# Patient Record
Sex: Male | Born: 1970 | Race: Black or African American | Hispanic: No | Marital: Married | Smoking: Never smoker
Health system: Southern US, Community
[De-identification: ages and names within clinical notes are randomized; demographics above are authoritative.]

## PROBLEM LIST (undated history)

## (undated) DIAGNOSIS — R51 Headache: Secondary | ICD-10-CM

## (undated) DIAGNOSIS — Z8669 Personal history of other diseases of the nervous system and sense organs: Secondary | ICD-10-CM

## (undated) DIAGNOSIS — R519 Headache, unspecified: Secondary | ICD-10-CM

## (undated) HISTORY — DX: Personal history of other diseases of the nervous system and sense organs: Z86.69

---

## 2002-10-09 ENCOUNTER — Emergency Department (HOSPITAL_COMMUNITY): Admission: EM | Admit: 2002-10-09 | Discharge: 2002-10-09 | Payer: Self-pay | Admitting: Emergency Medicine

## 2002-10-09 ENCOUNTER — Encounter: Payer: Self-pay | Admitting: Emergency Medicine

## 2002-12-25 ENCOUNTER — Encounter: Payer: Self-pay | Admitting: Orthopaedic Surgery

## 2002-12-25 ENCOUNTER — Ambulatory Visit (HOSPITAL_COMMUNITY): Admission: RE | Admit: 2002-12-25 | Discharge: 2002-12-25 | Payer: Self-pay | Admitting: Orthopaedic Surgery

## 2004-09-29 ENCOUNTER — Ambulatory Visit: Payer: Self-pay | Admitting: Family Medicine

## 2005-01-08 ENCOUNTER — Ambulatory Visit: Payer: Self-pay | Admitting: Family Medicine

## 2005-02-06 ENCOUNTER — Ambulatory Visit (HOSPITAL_COMMUNITY): Admission: RE | Admit: 2005-02-06 | Discharge: 2005-02-06 | Payer: Self-pay | Admitting: Family Medicine

## 2005-02-06 ENCOUNTER — Ambulatory Visit: Payer: Self-pay | Admitting: Family Medicine

## 2005-02-13 ENCOUNTER — Ambulatory Visit: Payer: Self-pay | Admitting: Family Medicine

## 2005-10-22 ENCOUNTER — Ambulatory Visit: Payer: Self-pay | Admitting: Family Medicine

## 2006-07-04 ENCOUNTER — Encounter: Payer: Self-pay | Admitting: Family Medicine

## 2006-07-04 DIAGNOSIS — J309 Allergic rhinitis, unspecified: Secondary | ICD-10-CM | POA: Insufficient documentation

## 2006-07-04 DIAGNOSIS — G43909 Migraine, unspecified, not intractable, without status migrainosus: Secondary | ICD-10-CM | POA: Insufficient documentation

## 2007-06-10 ENCOUNTER — Ambulatory Visit: Payer: Self-pay | Admitting: Family Medicine

## 2007-06-10 DIAGNOSIS — J019 Acute sinusitis, unspecified: Secondary | ICD-10-CM

## 2007-06-12 ENCOUNTER — Ambulatory Visit: Payer: Self-pay | Admitting: Family Medicine

## 2007-06-12 DIAGNOSIS — R209 Unspecified disturbances of skin sensation: Secondary | ICD-10-CM

## 2007-06-16 ENCOUNTER — Encounter (INDEPENDENT_AMBULATORY_CARE_PROVIDER_SITE_OTHER): Payer: Self-pay | Admitting: Family Medicine

## 2007-06-17 ENCOUNTER — Telehealth (INDEPENDENT_AMBULATORY_CARE_PROVIDER_SITE_OTHER): Payer: Self-pay | Admitting: *Deleted

## 2007-06-17 LAB — CONVERTED CEMR LAB
ALT: 36 units/L (ref 0–53)
AST: 29 units/L (ref 0–37)
Alkaline Phosphatase: 54 units/L (ref 39–117)
BUN: 13 mg/dL (ref 6–23)
Basophils Absolute: 0 10*3/uL (ref 0.0–0.1)
Basophils Relative: 1 % (ref 0–1)
CO2: 26 meq/L (ref 19–32)
Calcium: 9 mg/dL (ref 8.4–10.5)
Chloride: 106 meq/L (ref 96–112)
Cholesterol: 166 mg/dL (ref 0–200)
Creatinine, Ser: 1.29 mg/dL (ref 0.40–1.50)
Eosinophils Relative: 3 % (ref 0–5)
Glucose, Bld: 90 mg/dL (ref 70–99)
HCT: 42.6 % (ref 39.0–52.0)
HDL: 49 mg/dL (ref >39)
Hemoglobin: 14.8 g/dL (ref 13.0–17.0)
LDL Cholesterol: 102 mg/dL — ABNORMAL HIGH (ref 0–99)
MCHC: 34.7 g/dL (ref 30.0–36.0)
MCV: 89.9 fL (ref 78.0–100.0)
Monocytes Absolute: 0.4 10*3/uL (ref 0.1–1.0)
Monocytes Relative: 8 % (ref 3–12)
Neutro Abs: 2.3 10*3/uL (ref 1.7–7.7)
Neutrophils Relative %: 43 % (ref 43–77)
Platelets: 183 10*3/uL (ref 150–400)
RDW: 12.9 % (ref 11.5–15.5)
Sodium: 142 meq/L (ref 135–145)
TSH: 1.866 microintl units/mL (ref 0.350–5.50)
Total Bilirubin: 1.5 mg/dL — ABNORMAL HIGH (ref 0.3–1.2)
Total CHOL/HDL Ratio: 3.4
Total Protein: 7.3 g/dL (ref 6.0–8.3)
Triglycerides: 75 mg/dL (ref <150)
VLDL: 15 mg/dL (ref 0–40)
WBC: 5.4 10*3/uL (ref 4.0–10.5)

## 2007-07-31 ENCOUNTER — Encounter: Payer: Self-pay | Admitting: Family Medicine

## 2008-06-03 ENCOUNTER — Ambulatory Visit: Payer: Self-pay | Admitting: Family Medicine

## 2008-06-03 DIAGNOSIS — K219 Gastro-esophageal reflux disease without esophagitis: Secondary | ICD-10-CM | POA: Insufficient documentation

## 2008-06-18 ENCOUNTER — Encounter (INDEPENDENT_AMBULATORY_CARE_PROVIDER_SITE_OTHER): Payer: Self-pay | Admitting: Family Medicine

## 2008-06-21 ENCOUNTER — Encounter (INDEPENDENT_AMBULATORY_CARE_PROVIDER_SITE_OTHER): Payer: Self-pay | Admitting: *Deleted

## 2008-06-21 LAB — CONVERTED CEMR LAB
AST: 25 units/L (ref 0–37)
Albumin: 4.5 g/dL (ref 3.5–5.2)
Alkaline Phosphatase: 61 units/L (ref 39–117)
BUN: 15 mg/dL (ref 6–23)
Basophils Absolute: 0 10*3/uL (ref 0.0–0.1)
CO2: 25 meq/L (ref 19–32)
Calcium: 9.1 mg/dL (ref 8.4–10.5)
Chloride: 104 meq/L (ref 96–112)
Eosinophils Absolute: 0.1 10*3/uL (ref 0.0–0.7)
Eosinophils Relative: 2 % (ref 0–5)
Glucose, Bld: 92 mg/dL (ref 70–99)
HCT: 42.9 % (ref 39.0–52.0)
HDL: 53 mg/dL (ref >39)
Hemoglobin: 14.5 g/dL (ref 13.0–17.0)
LDL Cholesterol: 100 mg/dL — ABNORMAL HIGH (ref 0–99)
Lymphocytes Relative: 38 % (ref 12–46)
Lymphs Abs: 2.1 10*3/uL (ref 0.7–4.0)
MCHC: 33.8 g/dL (ref 30.0–36.0)
MCV: 89.4 fL (ref 78.0–100.0)
Monocytes Relative: 9 % (ref 3–12)
Neutro Abs: 2.9 10*3/uL (ref 1.7–7.7)
Neutrophils Relative %: 50 % (ref 43–77)
Platelets: 189 10*3/uL (ref 150–400)
Potassium: 4 meq/L (ref 3.5–5.3)
RBC: 4.8 M/uL (ref 4.22–5.81)
RDW: 13 % (ref 11.5–15.5)
Sodium: 141 meq/L (ref 135–145)
TSH: 2.169 microintl units/mL (ref 0.350–4.50)
Total Bilirubin: 1.4 mg/dL — ABNORMAL HIGH (ref 0.3–1.2)
Total CHOL/HDL Ratio: 3.1
Total Protein: 7.7 g/dL (ref 6.0–8.3)
Triglycerides: 63 mg/dL (ref <150)
VLDL: 13 mg/dL (ref 0–40)
WBC: 5.7 10*3/uL (ref 4.0–10.5)

## 2010-01-26 ENCOUNTER — Ambulatory Visit (HOSPITAL_COMMUNITY): Admission: RE | Admit: 2010-01-26 | Discharge: 2010-01-26 | Payer: Self-pay | Admitting: Family Medicine

## 2010-08-29 NOTE — Letter (Signed)
Summary: rpc chart  rpc chart   Imported By: Curtis Sites 05/17/2010 11:30:26  _____________________________________________________________________  External Attachment:    Type:   Image     Comment:   External Document

## 2013-10-15 ENCOUNTER — Emergency Department (HOSPITAL_COMMUNITY): Payer: BC Managed Care – PPO

## 2013-10-15 ENCOUNTER — Emergency Department (HOSPITAL_COMMUNITY)
Admission: EM | Admit: 2013-10-15 | Discharge: 2013-10-15 | Disposition: A | Discharge status: Home / Self Care | Payer: BC Managed Care – PPO | Attending: Emergency Medicine | Admitting: Emergency Medicine

## 2013-10-15 ENCOUNTER — Encounter (HOSPITAL_COMMUNITY): Payer: Self-pay | Admitting: Emergency Medicine

## 2013-10-15 DIAGNOSIS — Z791 Long term (current) use of non-steroidal anti-inflammatories (NSAID): Secondary | ICD-10-CM | POA: Insufficient documentation

## 2013-10-15 DIAGNOSIS — R05 Cough: Secondary | ICD-10-CM | POA: Insufficient documentation

## 2013-10-15 DIAGNOSIS — R0602 Shortness of breath: Secondary | ICD-10-CM | POA: Insufficient documentation

## 2013-10-15 DIAGNOSIS — R059 Cough, unspecified: Secondary | ICD-10-CM | POA: Insufficient documentation

## 2013-10-15 DIAGNOSIS — R0789 Other chest pain: Secondary | ICD-10-CM

## 2013-10-15 LAB — CBC WITH DIFFERENTIAL/PLATELET
Basophils Absolute: 0 10*3/uL (ref 0.0–0.1)
Basophils Relative: 0 % (ref 0–1)
EOS PCT: 2 % (ref 0–5)
Eosinophils Absolute: 0.1 10*3/uL (ref 0.0–0.7)
HCT: 39.2 % (ref 39.0–52.0)
Hemoglobin: 13.8 g/dL (ref 13.0–17.0)
LYMPHS ABS: 2.5 10*3/uL (ref 0.7–4.0)
LYMPHS PCT: 40 % (ref 12–46)
MCH: 31.2 pg (ref 26.0–34.0)
MCHC: 35.2 g/dL (ref 30.0–36.0)
MCV: 88.5 fL (ref 78.0–100.0)
MONOS PCT: 8 % (ref 3–12)
Monocytes Absolute: 0.5 10*3/uL (ref 0.1–1.0)
NEUTROS PCT: 51 % (ref 43–77)
Neutro Abs: 3.1 10*3/uL (ref 1.7–7.7)
PLATELETS: 203 10*3/uL (ref 150–400)
RBC: 4.43 MIL/uL (ref 4.22–5.81)
RDW: 12.8 % (ref 11.5–15.5)
WBC: 6.2 10*3/uL (ref 4.0–10.5)

## 2013-10-15 LAB — D-DIMER, QUANTITATIVE: D-Dimer, Quant: 0.4 ug/mL-FEU (ref 0.00–0.48)

## 2013-10-15 LAB — BASIC METABOLIC PANEL
BUN: 8 mg/dL (ref 6–23)
CO2: 27 mEq/L (ref 19–32)
Calcium: 9 mg/dL (ref 8.4–10.5)
Chloride: 105 mEq/L (ref 96–112)
Creatinine, Ser: 1.31 mg/dL (ref 0.50–1.35)
GFR calc Af Amer: 76 mL/min — ABNORMAL LOW (ref >90)
GFR calc non Af Amer: 65 mL/min — ABNORMAL LOW (ref >90)
GLUCOSE: 105 mg/dL — AB (ref 70–99)
Potassium: 4 mEq/L (ref 3.7–5.3)
Sodium: 142 mEq/L (ref 137–147)

## 2013-10-15 MED ORDER — NAPROXEN 500 MG PO TABS: 500.0000 mg | ORAL_TABLET | Freq: Two times a day (BID) | ORAL | Status: DC

## 2013-10-15 NOTE — ED Provider Notes (Signed)
CSN: [id number]     Arrival date & time 10/15/13  1436 History   This chart was scribed for Shelda Jakes, MD by Tana Conch, ED Scribe. This patient was seen in room APFT20/APFT20 and the patient's care was started at 3:50  PM.    Chief Complaint  Patient presents with  . Shoulder Pain     Patient is a 43 y.o. male presenting with shoulder pain. The history is provided by the patient. No language interpreter was used.  Shoulder Pain This is a new problem. The current episode started yesterday. The problem occurs rarely. The problem has been gradually worsening. Associated symptoms include chest pain and shortness of breath. Pertinent negatives include no abdominal pain and no headaches. The symptoms are aggravated by sneezing and coughing. Nothing relieves the symptoms. He has tried nothing for the symptoms.    HPI Comments: BORDEN THUNE is a 43 y.o. male who presents to the Emergency Department complaining of right shoulder blade pain that began yesterday and has worsened today. Pt reports that deep breaths, coughs make the pain worse and at times pt reports that he has associated SOB. Pt reports associated right anterior chest pain that started at the same time. Pt reports no previous injuries to the shoulder. Pt reports that he played "ball" Monday and felt fine afterward. Pain is localized medially along the right shoulder blade .     History reviewed. No pertinent past medical history. History reviewed. No pertinent past surgical history. History reviewed. No pertinent family history. History  Substance Use Topics  . Smoking status: Never Smoker   . Smokeless tobacco: Not on file  . Alcohol Use: No    Review of Systems  Constitutional: Negative for fever and chills.  HENT: Negative for rhinorrhea and sore throat.   Eyes: Negative for visual disturbance.  Respiratory: Positive for shortness of breath.   Cardiovascular: Positive for chest pain. Negative for leg  swelling.  Gastrointestinal: Negative for nausea, vomiting, abdominal pain and diarrhea.  Genitourinary: Negative for dysuria.  Musculoskeletal: Positive for back pain. Negative for neck pain.  Skin: Negative for rash.  Neurological: Negative for headaches.  Psychiatric/Behavioral: Negative for confusion.      Allergies  Azelastine hcl  Home Medications   Current Outpatient Rx  Name  Route  Sig  Dispense  Refill  . OVER THE COUNTER MEDICATION   Oral   Take 1 tablet by mouth daily as needed (for allergies). OTC allergy medication         . naproxen (NAPROSYN) 500 MG tablet   Oral   Take 1 tablet (500 mg total) by mouth 2 (two) times daily.   14 tablet   0    BP 118/76  Pulse 85  Temp(Src) 97.9 F (36.6 C) (Oral)  Resp 18  Ht 5\' 9"  (1.753 m)  Wt 178 lb (80.74 kg)  BMI 26.27 kg/m2  SpO2 97% Physical Exam  Nursing note and vitals reviewed. Constitutional: He is oriented to person, place, and time.  Cardiovascular: Normal rate, regular rhythm and normal heart sounds.   Pulmonary/Chest: Effort normal and breath sounds normal. Tenderness: right side   Abdominal: Bowel sounds are normal. There is no tenderness.  Musculoskeletal:       Arms: Neurological: He is alert and oriented to person, place, and time. No cranial nerve deficit. He exhibits normal muscle tone. Coordination normal.    ED Course  Procedures (including critical care time)  DIAGNOSTIC STUDIES: Oxygen Saturation is 97%  on RA, normal by my interpretation.    COORDINATION OF CARE:   3:58 PM-Discussed treatment plan which includes  with pt at bedside and pt agreed to plan.   Medications - No data to display  Results for orders placed during the hospital encounter of 10/15/13  D-DIMER, QUANTITATIVE      Result Value Ref Range   D-Dimer, Quant 0.40  0.00 - 0.48 ug/mL-FEU  BASIC METABOLIC PANEL      Result Value Ref Range   Sodium 142  137 - 147 mEq/L   Potassium 4.0  3.7 - 5.3 mEq/L   Chloride  105  96 - 112 mEq/L   CO2 27  19 - 32 mEq/L   Glucose, Bld 105 (*) 70 - 99 mg/dL   BUN 8  6 - 23 mg/dL   Creatinine, Ser 1.61  0.50 - 1.35 mg/dL   Calcium 9.0  8.4 - 09.6 mg/dL   GFR calc non Af Amer 65 (*) >90 mL/min   GFR calc Af Amer 76 (*) >90 mL/min  CBC WITH DIFFERENTIAL      Result Value Ref Range   WBC 6.2  4.0 - 10.5 K/uL   RBC 4.43  4.22 - 5.81 MIL/uL   Hemoglobin 13.8  13.0 - 17.0 g/dL   HCT 04.5  40.9 - 81.1 %   MCV 88.5  78.0 - 100.0 fL   MCH 31.2  26.0 - 34.0 pg   MCHC 35.2  30.0 - 36.0 g/dL   RDW 91.4  78.2 - 95.6 %   Platelets 203  150 - 400 K/uL   Neutrophils Relative % 51  43 - 77 %   Neutro Abs 3.1  1.7 - 7.7 K/uL   Lymphocytes Relative 40  12 - 46 %   Lymphs Abs 2.5  0.7 - 4.0 K/uL   Monocytes Relative 8  3 - 12 %   Monocytes Absolute 0.5  0.1 - 1.0 K/uL   Eosinophils Relative 2  0 - 5 %   Eosinophils Absolute 0.1  0.0 - 0.7 K/uL   Basophils Relative 0  0 - 1 %   Basophils Absolute 0.0  0.0 - 0.1 K/uL    Labs Review Labs Reviewed  BASIC METABOLIC PANEL - Abnormal; Notable for the following:    Glucose, Bld 105 (*)    GFR calc non Af Amer 65 (*)    GFR calc Af Amer 76 (*)    All other components within normal limits  D-DIMER, QUANTITATIVE  CBC WITH DIFFERENTIAL   Imaging Review Dg Chest 2 View  10/15/2013   CLINICAL DATA:  Possible right-sided lung nodule on the rib films.  EXAM: CHEST  2 VIEW  COMPARISON:  DG RIBS UNILATERAL W/CHEST*R* dated 10/15/2013  FINDINGS: 1808 hr. Bilateral nipple markers in place. Midline trachea. Normal heart size and mediastinal contours. No pleural effusion or pneumothorax. The questioned lung nodule on the prior exam is not appreciated. Lungs are clear.  IMPRESSION: No evidence of pulmonary nodule. No acute cardiopulmonary disease.   Electronically Signed   By: Jeronimo Greaves M.D.   On: 10/15/2013 18:25   Dg Ribs Unilateral W/chest Right  10/15/2013   CLINICAL DATA:  SHOULDER PAIN  EXAM: RIGHT RIBS AND CHEST - 3+ VIEW   COMPARISON:  None.  FINDINGS: No fracture or other bone lesions are seen involving the ribs. There is no evidence of pneumothorax or pleural effusion. A 9 mm nodular density projects within the right lung base. Finding may  represent a confluence of normal densities, a pulmonary nodule, or possibly a nipple shadow. Repeat PA and lateral view of the chest is recommended with nipple markers. The lungs otherwise clear.  IMPRESSION: No evidence of acute osseous abnormalities. Nodular density right lung base further evaluation with dedicated PA and lateral chest radiograph with nipple markers is recommended.   Electronically Signed   By: Salome HolmesHector  Cooper M.D.   On: 10/15/2013 17:10     EKG Interpretation   Date/Time:  Thursday October 15 2013 16:04:56 EDT Ventricular Rate:  42 PR Interval:  150 QRS Duration: 106 QT Interval:  514 QTC Calculation: 429 R Axis:   49 Text Interpretation:  Marked sinus bradycardia Nonspecific ST abnormality  Abnormal ECG No previous ECGs available Confirmed by Dillie Burandt  MD, Jaidev Sanger  [phone]([id number]) on 10/15/2013 5:54:54 PM      Date: 10/15/2013  Rate: 42  Rhythm: sinus bradycardia  QRS Axis: normal  Intervals: normal  ST/T Wave abnormalities: nonspecific ST changes  Conduction Disutrbances:none  Narrative Interpretation:   Old EKG Reviewed: none available     MDM   Final diagnoses:  Chest pain, muscular   I personally performed the services described in this documentation, which was scribed in my presence. The recorded information has been reviewed and is accurate.   Workup for the chest pain that was pleuritic in nature seems to be more muscular in nature. No evidence of pulmonary embolus with a negative d-dimer. EKG without acute cardiac changes. Chest x-ray negative for pneumonia pulmonary edema pneumothorax. There was some concern for a nodule but on PA and lateral that was not present. Patient's discomfort is probably muscular in nature small it is in the right  rhomboid muscles around the scapula. Patient retrieve anti-inflammatories and reassurance. Patient primary care Dr. followup with.    Shelda JakesScott W. Insiya Oshea, MD 10/15/13 [phone]

## 2013-10-15 NOTE — Discharge Instructions (Signed)
Chest Wall Pain Chest wall pain is pain in or around the bones and muscles of your chest. It may take up to 6 weeks to get better. It may take longer if you must stay physically active in your work and activities.  CAUSES  Chest wall pain may happen on its own. However, it may be caused by:  A viral illness like the flu.  Injury.  Coughing.  Exercise.  Arthritis.  Fibromyalgia.  Shingles. HOME CARE INSTRUCTIONS   Avoid overtiring physical activity. Try not to strain or perform activities that cause pain. This includes any activities using your chest or your abdominal and side muscles, especially if heavy weights are used.  Put ice on the sore area.  Put ice in a plastic bag.  Place a towel between your skin and the bag.  Leave the ice on for 15-20 minutes per hour while awake for the first 2 days.  Only take over-the-counter or prescription medicines for pain, discomfort, or fever as directed by your caregiver. SEEK IMMEDIATE MEDICAL CARE IF:   Your pain increases, or you are very uncomfortable.  You have a fever.  Your chest pain becomes worse.  You have new, unexplained symptoms.  You have nausea or vomiting.  You feel sweaty or lightheaded.  You have a cough with phlegm (sputum), or you cough up blood. MAKE SURE YOU:   Understand these instructions.  Will watch your condition.  Will get help right away if you are not doing well or get worse. Document Released: 07/16/2005 Document Revised: 10/08/2011 Document Reviewed: 03/12/2011 [facility] Patient Information 2014 [employer] [place] [place].  Take antinflammatory medicine as directed. Her workup was negative. Return for any new or worse symptoms. Wouldn't expect you to be better in about 4 days if not follow up with your regular Dr.

## 2013-10-15 NOTE — ED Notes (Signed)
Pt seen and evaluated by EDP for initial assessment. 

## 2013-10-15 NOTE — ED Notes (Signed)
Pain rt scapular area since yesterday Increased pain with movement.  No known injury ,no cough or cold

## 2015-04-26 IMAGING — CR DG CHEST 2V
2 series · 2 of 2 positions shown · non-contrast
Comparison: DG RIBS UNILATERAL W/CHEST*R* dated 10/15/2013

CLINICAL DATA: Possible right-sided lung nodule on the rib films.

EXAM:
CHEST  2 VIEW

[view not recorded (1 of 2)]
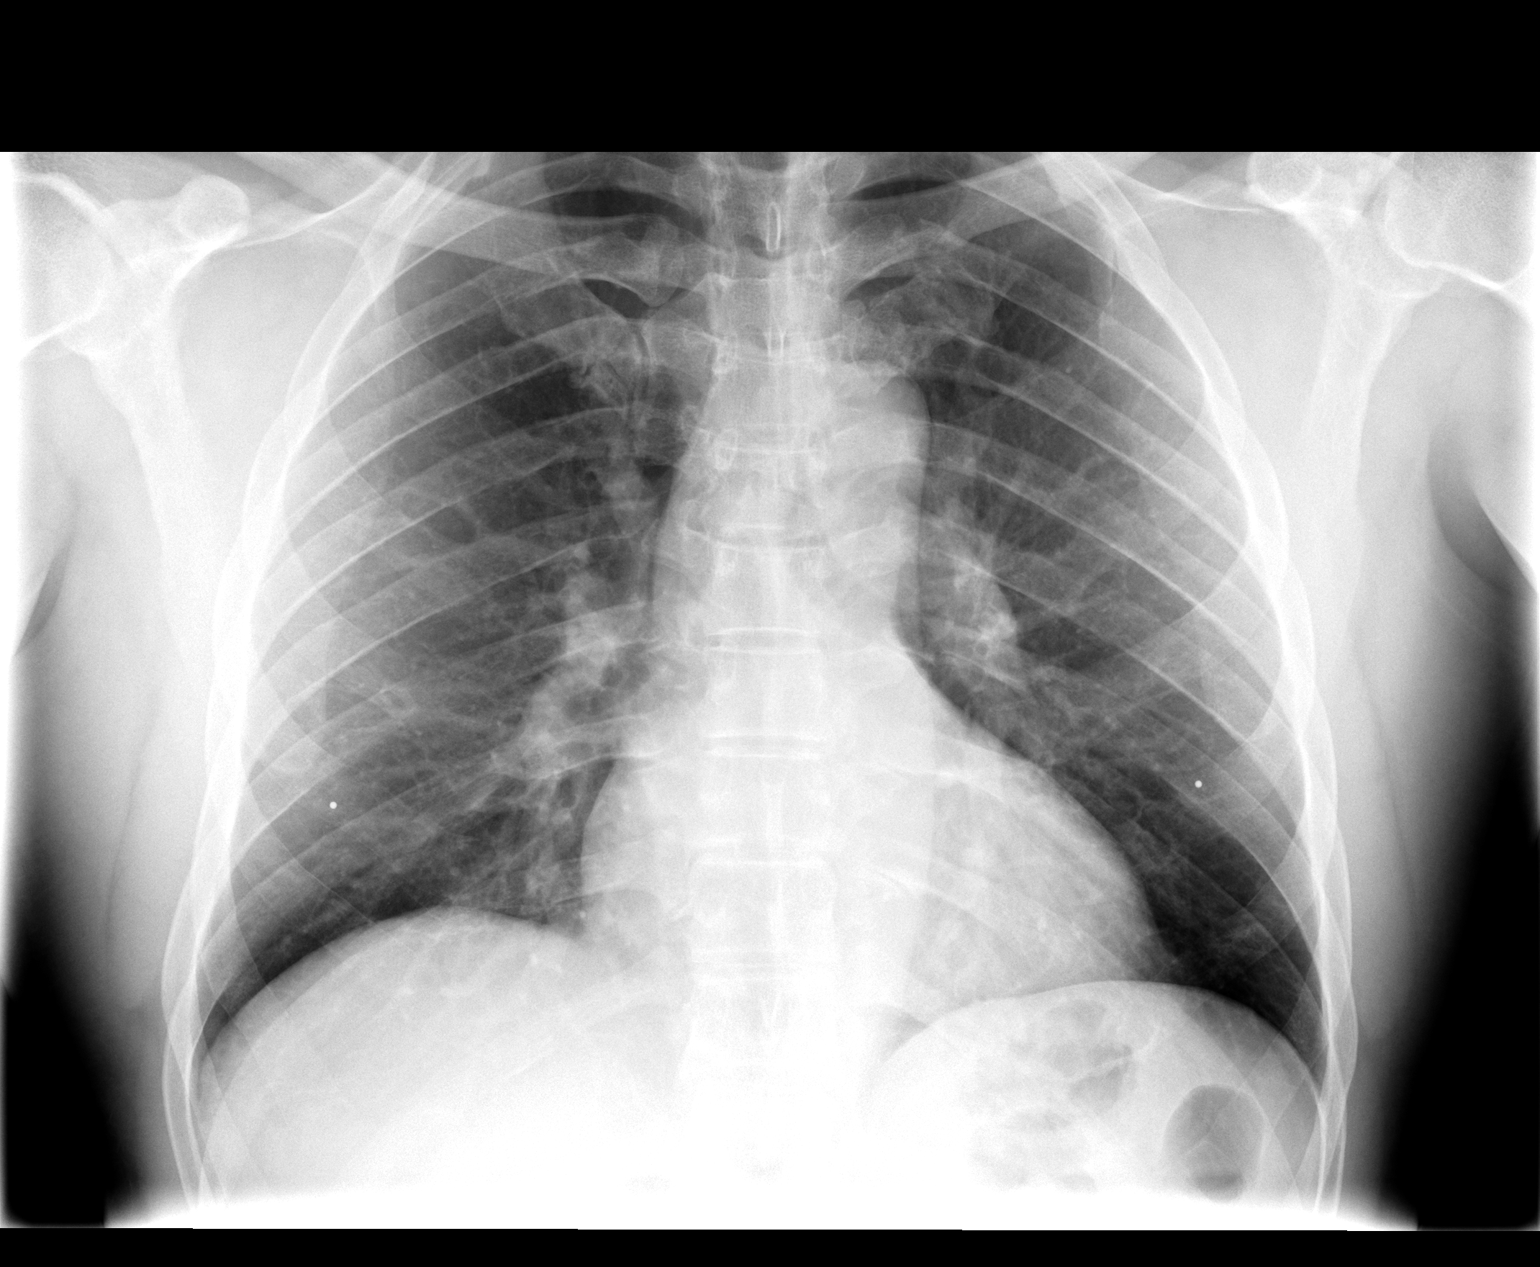

[view not recorded (2 of 2)]
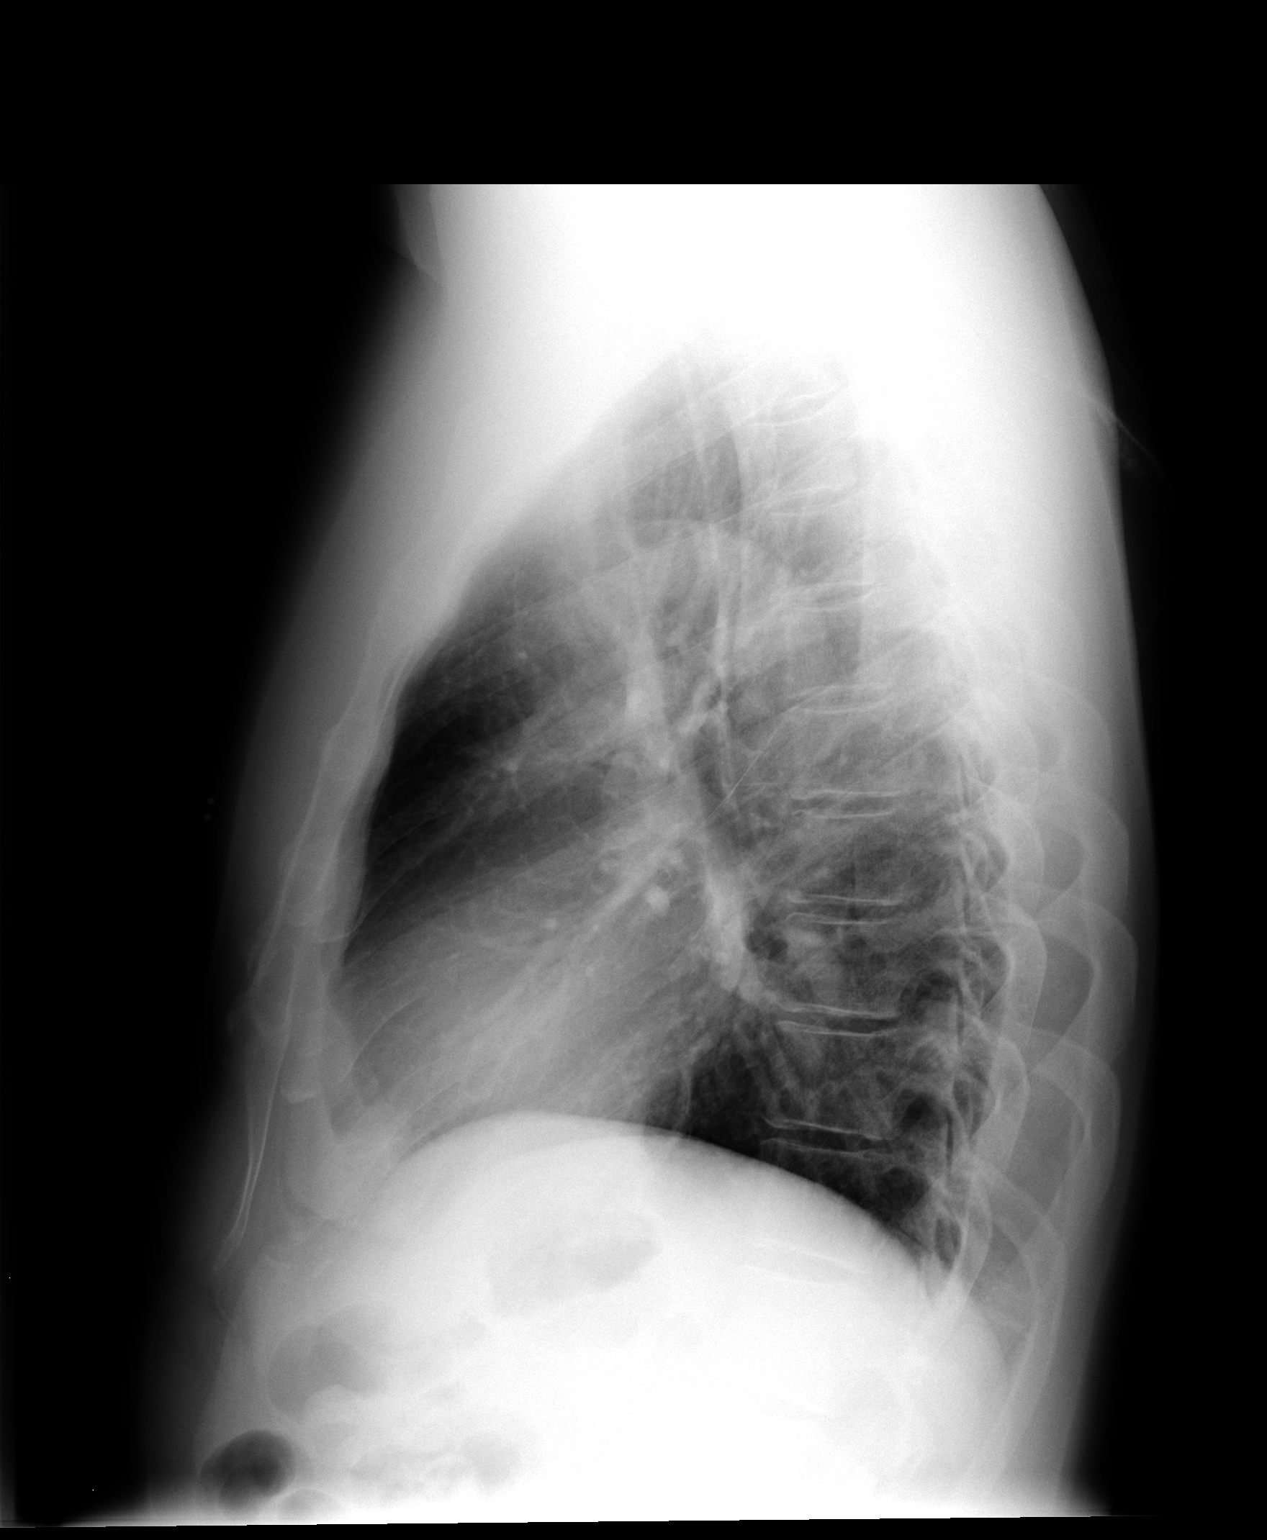

[2 of 2 positions shown; findings below may reference images not displayed]

FINDINGS: 0303 hr. Bilateral nipple markers in place. Midline trachea. Normal
heart size and mediastinal contours. No pleural effusion or
pneumothorax. The questioned lung nodule on the prior exam is not
appreciated. Lungs are clear.
IMPRESSION: No evidence of pulmonary nodule. No acute cardiopulmonary disease.

## 2015-04-26 IMAGING — CR DG RIBS W/ CHEST 3+V*R*
5 series · 5 of 5 positions shown · non-contrast
Comparison: None.

CLINICAL DATA: SHOULDER PAIN

EXAM:
RIGHT RIBS AND CHEST - 3+ VIEW

[view not recorded (1 of 5)]
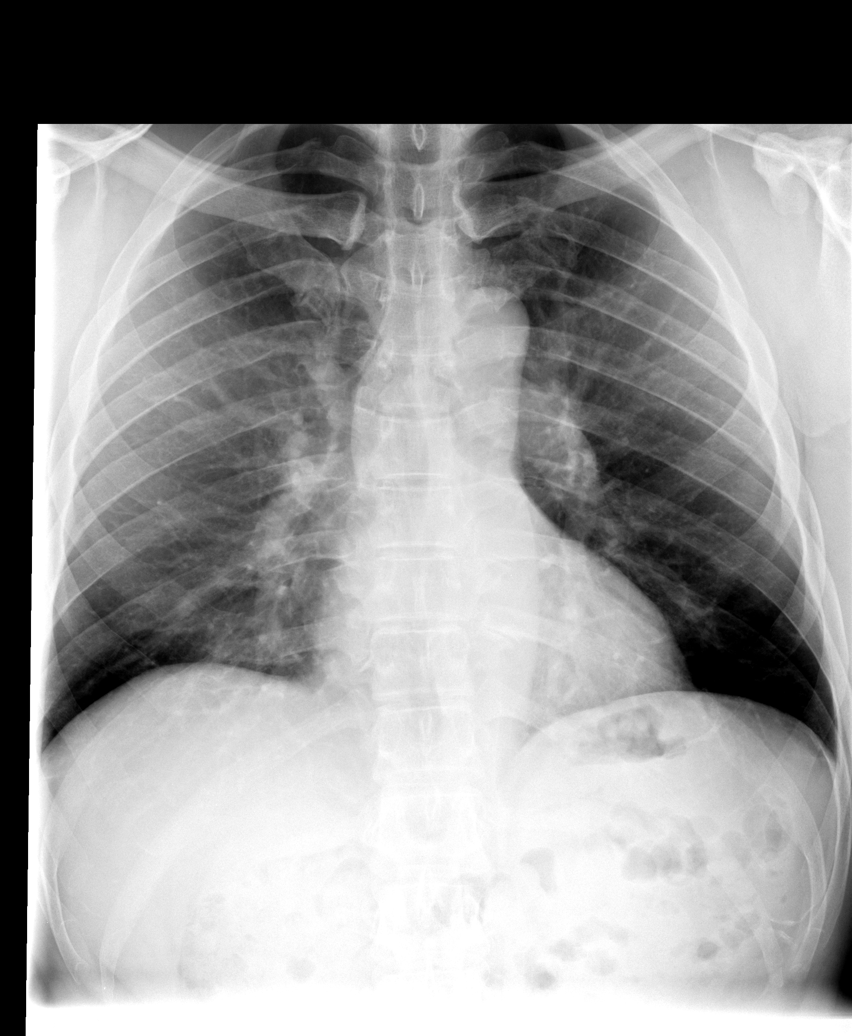

[view not recorded (2 of 5)]
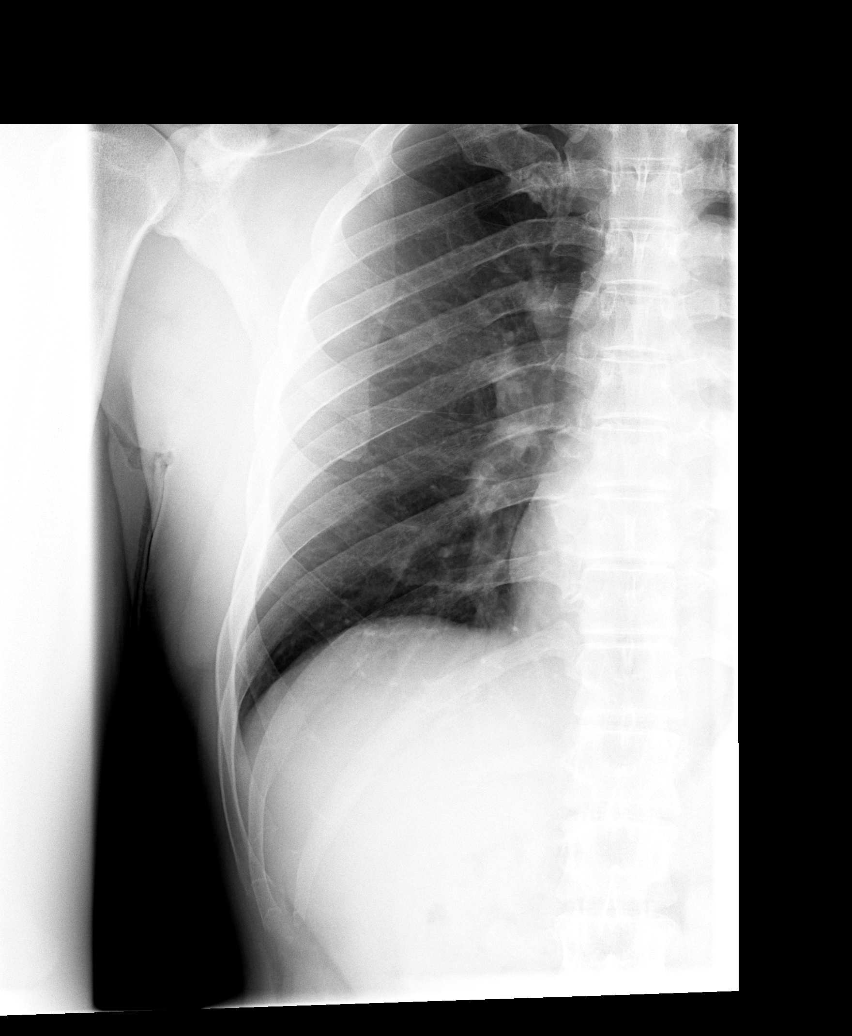

[view not recorded (3 of 5)]
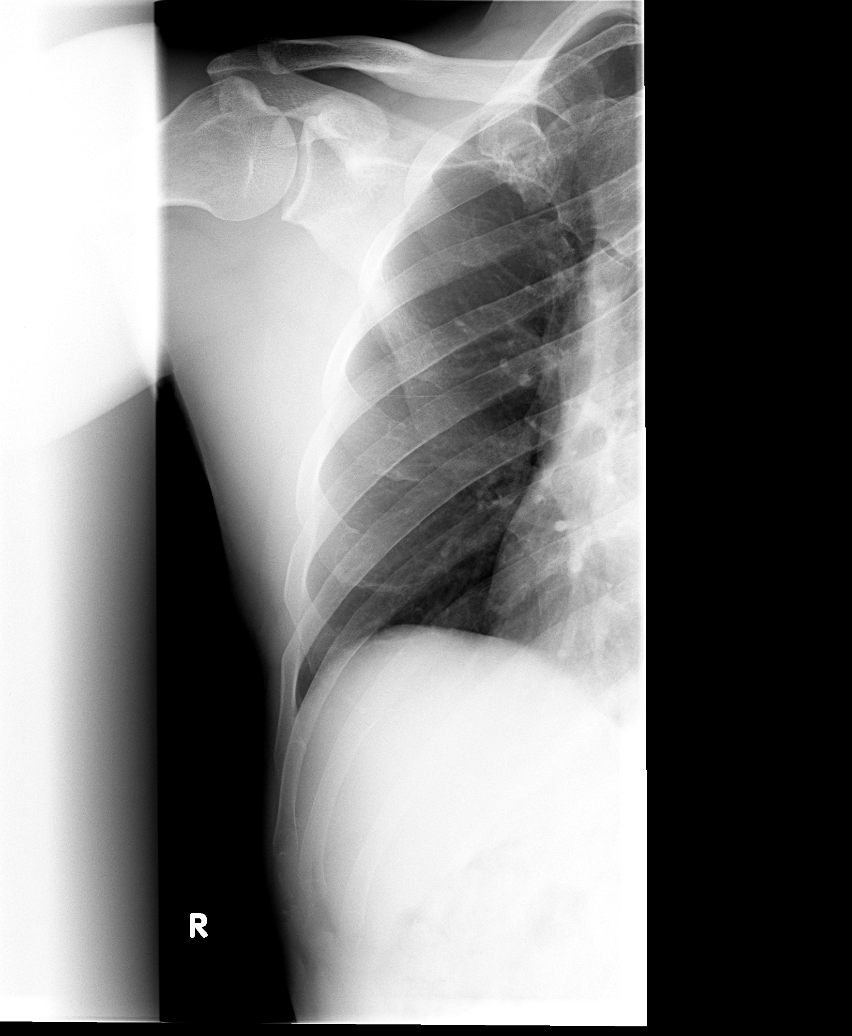

[view not recorded (4 of 5)]
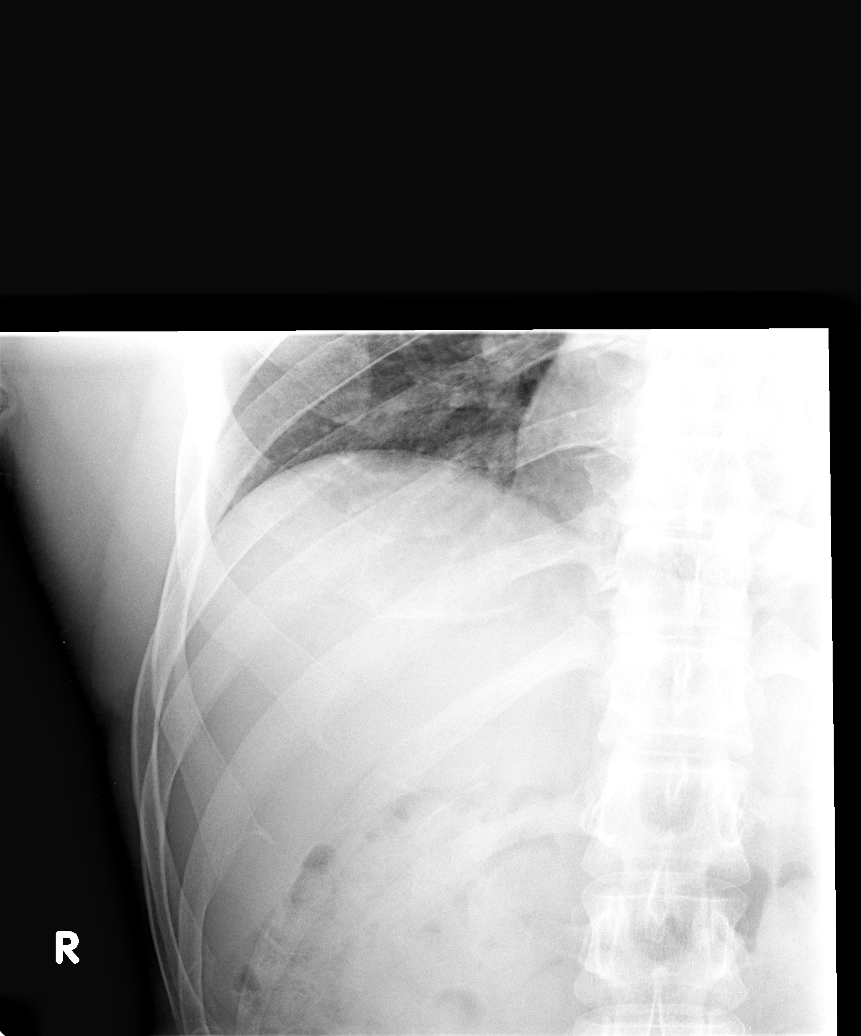

[view not recorded (5 of 5)]
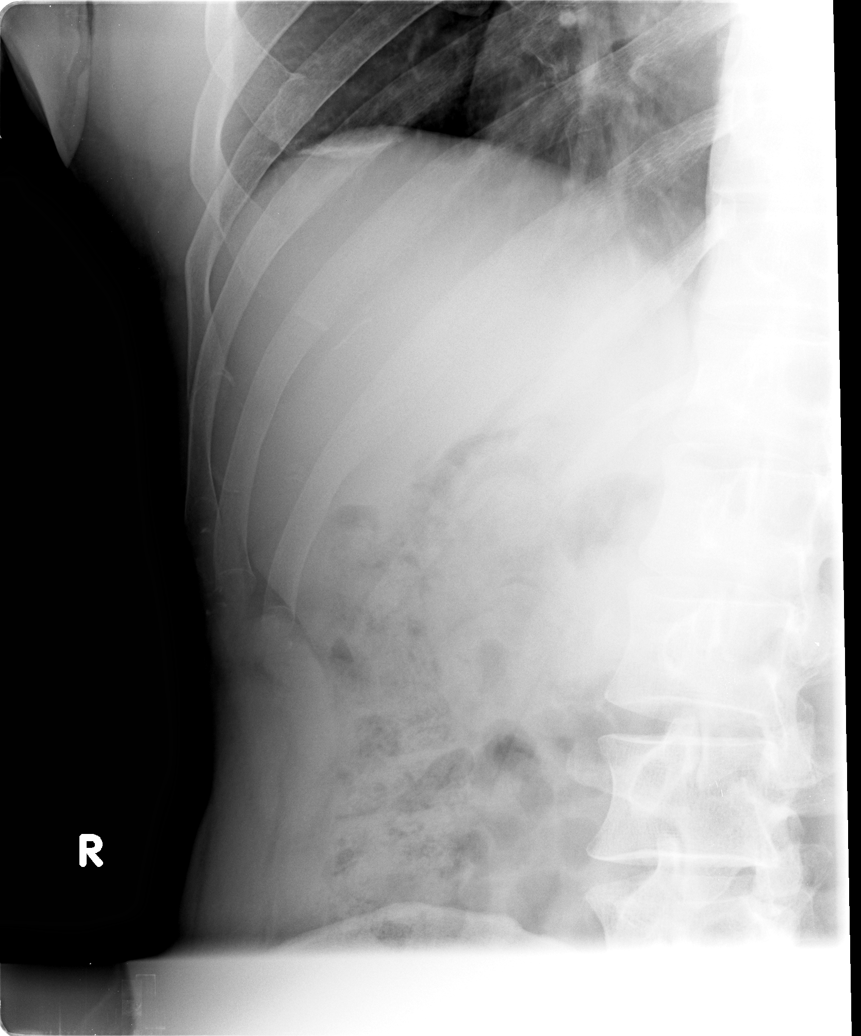

[5 of 5 positions shown; findings below may reference images not displayed]

FINDINGS: No fracture or other bone lesions are seen involving the ribs. There
is no evidence of pneumothorax or pleural effusion. A 9 mm nodular
density projects within the right lung base. Finding may represent a
confluence of normal densities, a pulmonary nodule, or possibly a
nipple shadow. Repeat PA and lateral view of the chest is
recommended with nipple markers. The lungs otherwise clear.
IMPRESSION: No evidence of acute osseous abnormalities. Nodular density right
lung base further evaluation with dedicated PA and lateral chest
radiograph with nipple markers is recommended.

## 2015-10-07 ENCOUNTER — Encounter (HOSPITAL_COMMUNITY): Payer: Self-pay | Admitting: Emergency Medicine

## 2015-10-07 ENCOUNTER — Emergency Department (HOSPITAL_COMMUNITY)
Admission: EM | Admit: 2015-10-07 | Discharge: 2015-10-07 | Disposition: A | Discharge status: Home / Self Care | Payer: BLUE CROSS/BLUE SHIELD | Attending: Emergency Medicine | Admitting: Emergency Medicine

## 2015-10-07 DIAGNOSIS — Z791 Long term (current) use of non-steroidal anti-inflammatories (NSAID): Secondary | ICD-10-CM | POA: Insufficient documentation

## 2015-10-07 DIAGNOSIS — Z7982 Long term (current) use of aspirin: Secondary | ICD-10-CM | POA: Diagnosis not present

## 2015-10-07 DIAGNOSIS — S39012A Strain of muscle, fascia and tendon of lower back, initial encounter: Secondary | ICD-10-CM | POA: Insufficient documentation

## 2015-10-07 DIAGNOSIS — Y999 Unspecified external cause status: Secondary | ICD-10-CM | POA: Insufficient documentation

## 2015-10-07 DIAGNOSIS — Y929 Unspecified place or not applicable: Secondary | ICD-10-CM | POA: Insufficient documentation

## 2015-10-07 DIAGNOSIS — Y93F2 Activity, caregiving, lifting: Secondary | ICD-10-CM | POA: Diagnosis not present

## 2015-10-07 DIAGNOSIS — Z79899 Other long term (current) drug therapy: Secondary | ICD-10-CM | POA: Diagnosis not present

## 2015-10-07 DIAGNOSIS — X58XXXA Exposure to other specified factors, initial encounter: Secondary | ICD-10-CM | POA: Diagnosis not present

## 2015-10-07 DIAGNOSIS — S3992XA Unspecified injury of lower back, initial encounter: Secondary | ICD-10-CM | POA: Diagnosis present

## 2015-10-07 HISTORY — DX: Headache, unspecified: R51.9

## 2015-10-07 HISTORY — DX: Headache: R51

## 2015-10-07 MED ORDER — NAPROXEN 500 MG PO TABS: 500.0000 mg | ORAL_TABLET | Freq: Two times a day (BID) | ORAL | Status: DC

## 2015-10-07 MED ORDER — METHOCARBAMOL 500 MG PO TABS
500.0000 mg | ORAL_TABLET | Freq: Once | ORAL | Status: AC
  Administered 2015-10-07: 500 mg via ORAL
  Filled 2015-10-07: qty 1

## 2015-10-07 MED ORDER — HYDROCODONE-ACETAMINOPHEN 5-325 MG PO TABS: 1.0000 | ORAL_TABLET | ORAL | Status: DC | PRN

## 2015-10-07 MED ORDER — METHOCARBAMOL 500 MG PO TABS: 1000.0000 mg | ORAL_TABLET | Freq: Four times a day (QID) | ORAL | Status: AC

## 2015-10-07 MED ORDER — HYDROCODONE-ACETAMINOPHEN 5-325 MG PO TABS
1.0000 | ORAL_TABLET | Freq: Once | ORAL | Status: AC
  Administered 2015-10-07: 1 via ORAL
  Filled 2015-10-07: qty 1

## 2015-10-07 NOTE — Discharge Instructions (Signed)
Lumbosacral Strain Lumbosacral strain is a strain of any of the parts that make up your lumbosacral vertebrae. Your lumbosacral vertebrae are the bones that make up the lower third of your backbone. Your lumbosacral vertebrae are held together by muscles and tough, fibrous tissue (ligaments).  CAUSES  A sudden blow to your back can cause lumbosacral strain. Also, anything that causes an excessive stretch of the muscles in the low back can cause this strain. This is typically seen when people exert themselves strenuously, fall, lift heavy objects, bend, or crouch repeatedly. RISK FACTORS  Physically demanding work.  Participation in pushing or pulling sports or sports that require a sudden twist of the back (tennis, golf, baseball).  Weight lifting.  Excessive lower back curvature.  Forward-tilted pelvis.  Weak back or abdominal muscles or both.  Tight hamstrings. SIGNS AND SYMPTOMS  Lumbosacral strain may cause pain in the area of your injury or pain that moves (radiates) down your leg.  DIAGNOSIS Your health care provider can often diagnose lumbosacral strain through a physical exam. In some cases, you may need tests such as X-ray exams.  TREATMENT  Treatment for your lower back injury depends on many factors that your clinician will have to evaluate. However, most treatment will include the use of anti-inflammatory medicines. HOME CARE INSTRUCTIONS   Avoid hard physical activities (tennis, racquetball, waterskiing) if you are not in proper physical condition for it. This may aggravate or create problems.  If you have a back problem, avoid sports requiring sudden body movements. Swimming and walking are generally safer activities.  Maintain good posture.  Maintain a healthy weight.  For acute conditions, you may put ice on the injured area.  Put ice in a plastic bag.  Place a towel between your skin and the bag.  Leave the ice on for 20 minutes, 2-3 times a day.  When the  low back starts healing, stretching and strengthening exercises may be recommended. SEEK MEDICAL CARE IF:  Your back pain is getting worse.  You experience severe back pain not relieved with medicines. SEEK IMMEDIATE MEDICAL CARE IF:   You have numbness, tingling, weakness, or problems with the use of your arms or legs.  There is a change in bowel or bladder control.  You have increasing pain in any area of the body, including your belly (abdomen).  You notice shortness of breath, dizziness, or feel faint.  You feel sick to your stomach (nauseous), are throwing up (vomiting), or become sweaty.  You notice discoloration of your toes or legs, or your feet get very cold. MAKE SURE YOU:   Understand these instructions.  Will watch your condition.  Will get help right away if you are not doing well or get worse.   This information is not intended to replace advice given to you by your health care provider. Make sure you discuss any questions you have with your health care provider.   Document Released: 04/25/2005 Document Revised: 08/06/2014 Document Reviewed: 03/04/2013 [employer] Interactive Patient Education 2016 [employer]     Do not drive within 4 hours of taking hydrocodone as this will make you drowsy.  Avoid lifting,  Bending,  Twisting or any other activity that worsens your pain over the next week.  Apply a heating pad for 20 minutes 3 times daily.  You should get rechecked if your symptoms are not better over the next 5 days,  Or you develop increased pain,  Weakness in your leg(s) or loss of bladder or  bowel function - these are symptoms of a worse injury.

## 2015-10-07 NOTE — ED Notes (Signed)
Patient with c/o lower right side back pain after bending over and picking something up. Denies Bowel/bladder loss of control. States felt a pop.

## 2015-10-07 NOTE — ED Provider Notes (Signed)
CSN: [id number]     Arrival date & time 10/07/15  1005 History  By signing my name below, I, [name], attest that this documentation has been prepared under the direction and in the presence of Burgess AmorJulie Aisley Whan, PA-C. Electronically Signed: Marica OtterNusrat Rahman, ED Scribe. 10/09/2015. 11:46 AM.  Chief Complaint  Patient presents with  . Back Pain   The history is provided by the patient. No language interpreter was used.   PCP: No PCP Per Patient HPI Comments: Frederick Richardson is a 45 y.o. male, with PMHx noted below, who presents to the Emergency Department complaining of atraumatic (pt denies any specific injury or fall, however, notes his job often requires him to lift heavy objects), sudden onset, 6/10, aching, lower, right sided back pain onset 4 days ago after pt bent over to pick something up and felt a pop. Aggravating factors include movement. Pt reports using a heating pad and taking OTC meds including Aleve, ibuprofen, and tylenol at home with some relief.  Pt reports one prior episode of similar Sx a few years ago. Pt denies bowel or urinary incontinence, hematuria, dysuria, radiating pain, weakness of BLE. Pt also denies Hx of kidney stones.   Past Medical History  Diagnosis Date  . Headache   . GERD (gastroesophageal reflux disease)    History reviewed. No pertinent past surgical history. No family history on file. Social History  Substance Use Topics  . Smoking status: Never Smoker   . Smokeless tobacco: None  . Alcohol Use: No    Review of Systems  Constitutional: Negative for appetite change and fatigue.  HENT: Negative for congestion, ear discharge and sinus pressure.   Eyes: Negative for discharge.  Respiratory: Negative for cough.   Cardiovascular: Negative for chest pain.  Gastrointestinal: Negative for abdominal pain and diarrhea.  Genitourinary: Negative for dysuria, frequency, hematuria and difficulty urinating.  Musculoskeletal: Positive for back pain.  Skin:  Negative for rash.  Neurological: Negative for seizures, weakness and headaches.  Psychiatric/Behavioral: Negative for hallucinations.   Allergies  Azelastine hcl  Home Medications   Prior to Admission medications   Medication Sig Start Date End Date Taking? Authorizing Provider  Aspirin-Caffeine 845-65 MG PACK Take 1 packet by mouth daily as needed (pain).   Yes Historical Provider, MD  diphenhydrAMINE (BENADRYL) 25 mg capsule Take 25 mg by mouth daily.   Yes Historical Provider, MD  naproxen sodium (ANAPROX) 220 MG tablet Take 220 mg by mouth daily as needed (pain).   Yes Historical Provider, MD  HYDROcodone-acetaminophen (NORCO/VICODIN) 5-325 MG tablet Take 1 tablet by mouth every 4 (four) hours as needed. 10/07/15   Burgess AmorJulie Sylvie Mifsud, PA-C  methocarbamol (ROBAXIN) 500 MG tablet Take 2 tablets (1,000 mg total) by mouth 4 (four) times daily. 10/07/15 10/17/15  Burgess AmorJulie Ronzell Laban, PA-C  naproxen (NAPROSYN) 500 MG tablet Take 1 tablet (500 mg total) by mouth 2 (two) times daily. 10/07/15   Burgess AmorJulie Kyaire Gruenewald, PA-C   Triage Vitals: BP 128/82 mmHg  Pulse 54  Temp(Src) 97.8 F (36.6 C) (Oral)  Resp 18  Ht 5\' 9"  (1.753 m)  Wt 178 lb (80.74 kg)  BMI 26.27 kg/m2  SpO2 98% Physical Exam  Constitutional: He appears well-developed and well-nourished.  HENT:  Head: Normocephalic.  Eyes: Conjunctivae are normal.  Neck: Normal range of motion. Neck supple.  Cardiovascular: Normal rate and intact distal pulses.   Pedal pulses normal.  Pulmonary/Chest: Effort normal.  Abdominal: Soft. Bowel sounds are normal. He exhibits no distension and no mass.  Musculoskeletal: Normal range of motion. He exhibits no edema.       Lumbar back: He exhibits tenderness. He exhibits no swelling, no edema and no spasm.  Neurological: He is alert. He has normal strength. He displays no atrophy and no tremor. No sensory deficit. Gait normal.  Reflex Scores:      Patellar reflexes are 2+ on the right side and 2+ on the left side.       Achilles reflexes are 2+ on the right side and 2+ on the left side. No strength deficit noted in hip and knee flexor and extensor muscle groups.  Ankle flexion and extension intact.  Skin: Skin is warm and dry.  Psychiatric: He has a normal mood and affect.  Nursing note and vitals reviewed.   ED Course  Procedures (including critical care time) DIAGNOSTIC STUDIES: Oxygen Saturation is 98% on ra, nl by my interpretation.    COORDINATION OF CARE: 11:41 AM: Discussed treatment plan which includes antiinflammatories, muscle relaxer, meds for pain management, work note, heating pad use, and return precautions with pt at bedside; patient verbalizes understanding and agrees with treatment plan.  MDM   Final diagnoses:  Lumbar strain, initial encounter    Provider not advised of pt's dc vital signs.  Unaware of heart rate of 40 at dc.  No sx or complaint of sx suggesting symptomatic bradycardia during visit.  I personally performed the services described in this documentation, which was scribed in my presence. The recorded information has been reviewed and is accurate.   Burgess Amor, PA-C 10/09/15 [id number]  Samuel Jester, DO 10/10/15 [phone]

## 2016-03-05 ENCOUNTER — Ambulatory Visit: Payer: BLUE CROSS/BLUE SHIELD | Admitting: Family Medicine

## 2016-03-20 ENCOUNTER — Ambulatory Visit (INDEPENDENT_AMBULATORY_CARE_PROVIDER_SITE_OTHER): Payer: BLUE CROSS/BLUE SHIELD | Admitting: Family Medicine

## 2016-03-20 ENCOUNTER — Encounter: Payer: Self-pay | Admitting: Family Medicine

## 2016-03-20 VITALS — BP 146/100 | HR 76 | Temp 97.7°F | Resp 14 | Ht 69.0 in | Wt 175.0 lb

## 2016-03-20 DIAGNOSIS — Z0000 Encounter for general adult medical examination without abnormal findings: Secondary | ICD-10-CM

## 2016-03-20 LAB — CBC WITH DIFFERENTIAL/PLATELET
BASOS ABS: 0 {cells}/uL (ref 0–200)
Basophils Relative: 0 %
Eosinophils Absolute: 148 cells/uL (ref 15–500)
Eosinophils Relative: 2 %
HCT: 41.9 % (ref 38.5–50.0)
HEMOGLOBIN: 14 g/dL (ref 13.0–17.0)
LYMPHS ABS: 3626 {cells}/uL (ref 850–3900)
Lymphocytes Relative: 49 %
MCH: 30.1 pg (ref 27.0–33.0)
MCHC: 33.4 g/dL (ref 32.0–36.0)
MCV: 90.1 fL (ref 80.0–100.0)
MONOS PCT: 9 %
MPV: 10.6 fL (ref 7.5–12.5)
Monocytes Absolute: 666 cells/uL (ref 200–950)
NEUTROS PCT: 40 %
Neutro Abs: 2960 cells/uL (ref 1500–7800)
Platelets: 202 10*3/uL (ref 140–400)
RBC: 4.65 MIL/uL (ref 4.20–5.80)
RDW: 14 % (ref 11.0–15.0)
WBC: 7.4 10*3/uL (ref 3.8–10.8)

## 2016-03-20 LAB — COMPLETE METABOLIC PANEL WITH GFR
ALBUMIN: 4.1 g/dL (ref 3.6–5.1)
ALK PHOS: 48 U/L (ref 40–115)
ALT: 35 U/L (ref 9–46)
AST: 32 U/L (ref 10–40)
BUN: 9 mg/dL (ref 7–25)
CHLORIDE: 105 mmol/L (ref 98–110)
CO2: 25 mmol/L (ref 20–31)
Calcium: 8.7 mg/dL (ref 8.6–10.3)
Creat: 1.17 mg/dL (ref 0.60–1.35)
GFR, Est African American: 87 mL/min (ref >=60)
GFR, Est Non African American: 75 mL/min (ref >=60)
Glucose, Bld: 85 mg/dL (ref 70–99)
POTASSIUM: 3.9 mmol/L (ref 3.5–5.3)
SODIUM: 138 mmol/L (ref 135–146)
Total Bilirubin: 0.7 mg/dL (ref 0.2–1.2)
Total Protein: 7 g/dL (ref 6.1–8.1)

## 2016-03-20 LAB — LIPID PANEL
CHOL/HDL RATIO: 2.8 ratio (ref <=5.0)
Cholesterol: 142 mg/dL (ref 125–200)
HDL: 51 mg/dL (ref >=40)
LDL Cholesterol: 75 mg/dL (ref <130)
Triglycerides: 80 mg/dL (ref <150)
VLDL: 16 mg/dL (ref <30)

## 2016-03-20 NOTE — Progress Notes (Signed)
Subjective:    Patient ID: Frederick Richardson, male    DOB: 02/02/1971, 45 y.o.   MRN: [id number]  HPI Patient is here today to establish care. His blood pressure is elevated today but he is anxious. I rechecked his blood pressure after allowing him to sit up and found his blood pressure to be 132/92. He states that he has never had high blood pressure in the past. He does not drink. He does not smoke. He is married with 2 stepchildren. He has no medical concerns today outside of some tenderness in his elbow which appears to be tennis elbow. Past medical history is unremarkable aside from migraines Past Medical History:  Diagnosis Date  . Headache   . Hx of migraines    No past surgical history on file. No current outpatient prescriptions on file prior to visit.   No current facility-administered medications on file prior to visit.    Allergies  Allergen Reactions  . Azelastine Hcl     REACTION: Headaches   Social History   Social History  . Marital status: Married    Spouse name: N/A  . Number of children: N/A  . Years of education: N/A   Occupational History  . Not on file.   Social History Main Topics  . Smoking status: Never Smoker  . Smokeless tobacco: Never Used  . Alcohol use No  . Drug use: No  . Sexual activity: Not on file   Other Topics Concern  . Not on file   Social History Narrative  . No narrative on file   Family History  Problem Relation Age of Onset  . Asthma Mother   . Alzheimer's disease Father   . Hypertension Father   . Heart disease Brother       Review of Systems  All other systems reviewed and are negative.      Objective:   Physical Exam  Constitutional: He is oriented to person, place, and time. He appears well-developed and well-nourished. No distress.  HENT:  Head: Normocephalic and atraumatic.  Right Ear: External ear normal.  Left Ear: External ear normal.  Nose: Nose normal.  Mouth/Throat: Oropharynx is clear and  moist. No oropharyngeal exudate.  Eyes: Conjunctivae and EOM are normal. Pupils are equal, round, and reactive to light. Right eye exhibits no discharge. Left eye exhibits no discharge. No scleral icterus.  Neck: Normal range of motion. Neck supple. No JVD present. No tracheal deviation present. No thyromegaly present.  Cardiovascular: Normal rate, regular rhythm, normal heart sounds and intact distal pulses.  Exam reveals no gallop and no friction rub.   No murmur heard. Pulmonary/Chest: Effort normal and breath sounds normal. No stridor. No respiratory distress. He has no wheezes. He has no rales. He exhibits no tenderness.  Abdominal: Soft. Bowel sounds are normal. He exhibits no distension and no mass. There is no tenderness. There is no rebound and no guarding.  Genitourinary: Rectum normal and prostate normal.  Musculoskeletal: Normal range of motion. He exhibits no edema, tenderness or deformity.  Lymphadenopathy:    He has no cervical adenopathy.  Neurological: He is alert and oriented to person, place, and time. He has normal reflexes. He displays normal reflexes. No cranial nerve deficit. He exhibits normal muscle tone. Coordination normal.  Skin: Skin is warm. No rash noted. He is not diaphoretic. No erythema. No pallor.  Psychiatric: He has a normal mood and affect. His behavior is normal. Judgment and thought content normal.  Vitals reviewed.  Assessment & Plan:  Routine general medical examination at a health care facility - Plan: CBC with Differential/Platelet, COMPLETE METABOLIC PANEL WITH GFR, Lipid panel, PSA  Aside from his blood pressure, his physical exam today is completely normal. I recommended that the patient check his blood pressure frequently over the next few days and provide those values to me. If consistently less than 140/90, no treatment is necessary. I will check a CBC, CMP, fasting lipid panel, and a PSA. The remainder of his preventative care is  up-to-date. He is not due for colonoscopy until age 45. He's not require hepatitis C screening.

## 2016-03-21 LAB — PSA: PSA: 0.7 ng/mL (ref <=4.0)

## 2016-03-23 ENCOUNTER — Encounter: Payer: Self-pay | Admitting: Family Medicine

## 2016-08-20 ENCOUNTER — Encounter: Payer: Self-pay | Admitting: Family Medicine

## 2016-08-20 ENCOUNTER — Ambulatory Visit (INDEPENDENT_AMBULATORY_CARE_PROVIDER_SITE_OTHER): Payer: BLUE CROSS/BLUE SHIELD | Admitting: Family Medicine

## 2016-08-20 VITALS — BP 122/80 | HR 60 | Temp 98.1°F | Resp 14 | Ht 69.0 in | Wt 175.0 lb

## 2016-08-20 DIAGNOSIS — A599 Trichomoniasis, unspecified: Secondary | ICD-10-CM

## 2016-08-20 DIAGNOSIS — R519 Headache, unspecified: Secondary | ICD-10-CM

## 2016-08-20 DIAGNOSIS — R51 Headache: Secondary | ICD-10-CM | POA: Diagnosis not present

## 2016-08-20 LAB — URINALYSIS, ROUTINE W REFLEX MICROSCOPIC
BILIRUBIN URINE: NEGATIVE
GLUCOSE, UA: NEGATIVE
Hgb urine dipstick: NEGATIVE
Ketones, ur: NEGATIVE
LEUKOCYTES UA: NEGATIVE
Nitrite: NEGATIVE
PROTEIN: NEGATIVE
Specific Gravity, Urine: 1.01 (ref 1.001–1.035)
pH: 5.5 (ref 5.0–8.0)

## 2016-08-20 MED ORDER — PREDNISONE 20 MG PO TABS: ORAL_TABLET | ORAL | 0 refills | Status: AC

## 2016-08-20 MED ORDER — METRONIDAZOLE 500 MG PO TABS: 2000.0000 mg | ORAL_TABLET | Freq: Once | ORAL | 0 refills | Status: AC

## 2016-08-20 MED ORDER — AMOXICILLIN 875 MG PO TABS: 875.0000 mg | ORAL_TABLET | Freq: Two times a day (BID) | ORAL | 0 refills | Status: AC

## 2016-08-20 NOTE — Progress Notes (Signed)
Subjective:    Patient ID: Frederick Richardson, male    DOB: 1971-04-21, 46 y.o.   MRN: [id number]  HPI  03/20/16 Patient is here today to establish care. His blood pressure is elevated today but he is anxious. I rechecked his blood pressure after allowing him to sit up and found his blood pressure to be 132/92. He states that he has never had high blood pressure in the past. He does not drink. He does not smoke. He is married with 2 stepchildren. He has no medical concerns today outside of some tenderness in his elbow which appears to be tennis elbow. Past medical history is unremarkable aside from migraines.  At that time, my plan was: Aside from his blood pressure, his physical exam today is completely normal. I recommended that the patient check his blood pressure frequently over the next few days and provide those values to me. If consistently less than 140/90, no treatment is necessary. I will check a CBC, CMP, fasting lipid panel, and a PSA. The remainder of his preventative care is up-to-date. He is not due for colonoscopy until age 43. He's not require hepatitis C screening.  08/20/16 Patient states that he does not have a history of migraines even though this is listed in his medical history. I reviewed his chart he did have an MRI performed for headaches in 2006 although he is confident that he does not have migraines. He presents today for a headache for 2 months. It is located between his eyes underneath his nasal bridge.  He describes the pain as a constant pressure which is relieved by Sudafed. He also reports postnasal drip. The headache is nonpulsatile. There is no photophobia or phonophobia. He denies any nausea or vomiting. He denies any fevers. Headache response to ibuprofen and Sudafed. He does report postnasal drip as his only other associated symptoms. His wife was recently diagnosed with trichomoniasis. His urinalysis today and urine microscopic exam reveals no trichomoniasis. Past  Medical History:  Diagnosis Date  . Headache   . Hx of migraines    No past surgical history on file. Current Outpatient Prescriptions on File Prior to Visit  Medication Sig Dispense Refill  . Cyanocobalamin (VITAMIN B 12 PO) Take by mouth.    . Multiple Vitamin (MULTI VITAMIN DAILY PO) Take by mouth.     No current facility-administered medications on file prior to visit.    Allergies  Allergen Reactions  . Azelastine Hcl     REACTION: Headaches   Social History   Social History  . Marital status: Married    Spouse name: N/A  . Number of children: N/A  . Years of education: N/A   Occupational History  . Not on file.   Social History Main Topics  . Smoking status: Never Smoker  . Smokeless tobacco: Never Used  . Alcohol use No  . Drug use: No  . Sexual activity: Not on file   Other Topics Concern  . Not on file   Social History Narrative  . No narrative on file   Family History  Problem Relation Age of Onset  . Asthma Mother   . Alzheimer's disease Father   . Hypertension Father   . Heart disease Brother       Review of Systems  All other systems reviewed and are negative.      Objective:   Physical Exam  Constitutional: He is oriented to person, place, and time. He appears well-developed and well-nourished. No distress.  HENT:  Head: Normocephalic and atraumatic.  Right Ear: External ear normal.  Left Ear: External ear normal.  Nose: Nose normal.  Mouth/Throat: Oropharynx is clear and moist. No oropharyngeal exudate.  Eyes: Conjunctivae and EOM are normal. Pupils are equal, round, and reactive to light. Right eye exhibits no discharge. Left eye exhibits no discharge. No scleral icterus.  Neck: Normal range of motion. Neck supple. No JVD present. No tracheal deviation present. No thyromegaly present.  Cardiovascular: Normal rate, regular rhythm, normal heart sounds and intact distal pulses.  Exam reveals no gallop and no friction rub.   No murmur  heard. Pulmonary/Chest: Effort normal and breath sounds normal. No stridor. No respiratory distress. He has no wheezes. He has no rales. He exhibits no tenderness.  Abdominal: Soft. Bowel sounds are normal. He exhibits no distension and no mass. There is no tenderness. There is no rebound and no guarding.  Genitourinary: Rectum normal and prostate normal.  Musculoskeletal: Normal range of motion. He exhibits no edema, tenderness or deformity.  Lymphadenopathy:    He has no cervical adenopathy.  Neurological: He is alert and oriented to person, place, and time. He has normal reflexes. No cranial nerve deficit. He exhibits normal muscle tone. Coordination normal.  Skin: Skin is warm. No rash noted. He is not diaphoretic. No erythema. No pallor.  Psychiatric: He has a normal mood and affect. His behavior is normal. Judgment and thought content normal.  Vitals reviewed.         Assessment & Plan:  Trichimoniasis - Plan: Urinalysis, Routine w reflex microscopic, metroNIDAZOLE (FLAGYL) 500 MG tablet  Sinus headache  This could be chronic sinusitis. I'll treat the patient with amoxicillin at her 75 mg by mouth twice a day for 10 days in addition to a prednisone taper pack. If symptoms go away no further workup is necessary. If symptoms persist, I will treat the patient has chronic migraines with Topamax. I will treat the patient empirically with Flagyl by mouth 1 for his exposure to trichomoniasis even though the urine microscopic exam is normal

## 2016-08-21 ENCOUNTER — Encounter: Payer: Self-pay | Admitting: Family Medicine

## 2021-09-04 ENCOUNTER — Ambulatory Visit (INDEPENDENT_AMBULATORY_CARE_PROVIDER_SITE_OTHER): Payer: BC Managed Care – PPO | Admitting: Urology

## 2021-09-04 ENCOUNTER — Other Ambulatory Visit: Payer: Self-pay

## 2021-09-04 ENCOUNTER — Encounter: Payer: Self-pay | Admitting: Urology

## 2021-09-04 VITALS — BP 127/81 | HR 50 | Wt 187.0 lb

## 2021-09-04 DIAGNOSIS — Z125 Encounter for screening for malignant neoplasm of prostate: Secondary | ICD-10-CM | POA: Diagnosis not present

## 2021-09-04 DIAGNOSIS — Z3009 Encounter for other general counseling and advice on contraception: Secondary | ICD-10-CM | POA: Diagnosis not present

## 2021-09-04 NOTE — Progress Notes (Addendum)
09/04/2021 2:23 PM   Frederick Richardson May 22, 1971 [id number]  Referring provider: Wendall Papa [address] [place],  [place] [address]  No chief complaint on file.   HPI:  Frederick Richardson is here to consider vasectomy. He has no kids. His wife has two kids. His wife is 52 yo and her kids are 95 yo and 36 yo. They considered vasectomy for several months. They are using timing and pull out.   He denies any prior scrotal or inguinal surgery.  He works at [employer].    PMH: No past medical history on file.  Surgical History: No past surgical history on file.  Home Medications:  Allergies as of 09/04/2021       Reactions   Azelastine Hcl    REACTION: Headaches        Medication List        Accurate as of September 04, 2021  2:23 PM. If you have any questions, ask your nurse or doctor.          amoxicillin 875 MG tablet Commonly known as: AMOXIL Take 1 tablet (875 mg total) by mouth 2 (two) times daily.   MULTI VITAMIN DAILY PO Take by mouth.   predniSONE 20 MG tablet Commonly known as: DELTASONE 3 tabs poqday 1-2, 2 tabs poqday 3-4, 1 tab poqday 5-6   VITAMIN B 12 PO Take by mouth.        Allergies:  Allergies  Allergen Reactions   Azelastine Hcl     REACTION: Headaches    Family History: Family History  Problem Relation Age of Onset   Asthma Mother    Alzheimer's disease Father    Hypertension Father    Heart disease Brother     Social History:  reports that he has never smoked. He has never used smokeless tobacco. He reports that he does not drink alcohol and does not use drugs.   Physical Exam: BP 127/81    Pulse (!) 50    Wt 187 lb (84.8 kg)    BMI 27.62 kg/m   Constitutional:  Alert and oriented, No acute distress. HEENT: [place] AT, moist mucus membranes.  Trachea midline, no masses. Cardiovascular: No clubbing, cyanosis, or edema. Respiratory: Normal respiratory effort, no increased work of breathing. GI: Abdomen is soft, nontender,  nondistended, no abdominal masses GU: No CVA tenderness Lymph: No cervical or inguinal lymphadenopathy. Skin: No rashes, bruises or suspicious lesions. Neurologic: Grossly intact, no focal deficits, moving all 4 extremities. Psychiatric: Normal mood and affect. GU: Penis circumcised, normal foreskin, testicles descended bilaterally and palpably normal, bilateral epididymis palpably normal, scrotum normal DRE: Prostate 25 g, smooth without hard area or nodule   Laboratory Data: Lab Results  Component Value Date   WBC 7.4 03/20/2016   HGB 14.0 03/20/2016   HCT 41.9 03/20/2016   MCV 90.1 03/20/2016   PLT 202 03/20/2016    Lab Results  Component Value Date   CREATININE 1.17 03/20/2016    Lab Results  Component Value Date   PSA 0.7 03/20/2016    No results found for: TESTOSTERONE  No results found for: HGBA1C  Urinalysis    Component Value Date/Time   COLORURINE YELLOW 08/20/2016 1041   APPEARANCEUR CLEAR 08/20/2016 1041   LABSPEC 1.010 08/20/2016 1041   PHURINE 5.5 08/20/2016 1041   GLUCOSEU NEGATIVE 08/20/2016 1041   HGBUR NEGATIVE 08/20/2016 1041   BILIRUBINUR NEGATIVE 08/20/2016 1041   KETONESUR NEGATIVE 08/20/2016 1041   PROTEINUR NEGATIVE 08/20/2016 1041   NITRITE  NEGATIVE 08/20/2016 1041   LEUKOCYTESUR NEGATIVE 08/20/2016 1041    No results found for: LABMICR, WBCUA, RBCUA, LABEPIT, MUCUS, BACTERIA  Pertinent Imaging:    Assessment & Plan:    1) vas eval - I discussed with the patient normal male genitourinary anatomy and passage of sperm. We discussed the nature of vasectomy and that vasectomy is intended to be a permanent form of contraception. Although options do exist for fertility after vasectomy they are not always successful and can be quite expensive. We discussed vasectomy does not produce immediate sterility and another  form of contraception is required until a postvasectomy semen analysis confirms no sperm. We discussed this can take several  weeks to months. We discussed that vasectomy is not 100% successful with the risk of pregnancy approximately 1 in 2000 following the procedure which may be due to late failure from rejoining of the vas ends.  Rarely repeat vasectomy is required. We discussed risks such as bleeding, infection, testicular atrophy, sperm granuloma, acute pain and permanent chronic scrotal pain among others. We discussed there are other permanent and nonpermanent alternatives to vasectomy including male sterilization. We discussed postop care and that no heavy lifting, ejaculation or strenuous exercise is recommended for one week. All questions answered.    2) PSA screening - dre normal. PSA was sent.   No follow-ups on file.  Jerilee Field, MD  [facility]  [address] [place], [place] [address] [phone]

## 2021-09-05 LAB — PSA: Prostate Specific Ag, Serum: 0.9 ng/mL (ref 0.0–4.0)

## 2021-09-18 ENCOUNTER — Encounter: Payer: BLUE CROSS/BLUE SHIELD | Admitting: Urology

## 2021-09-29 ENCOUNTER — Telehealth: Payer: Self-pay

## 2021-09-29 NOTE — Telephone Encounter (Signed)
Patient left a voicemail on 09-29-2021. ? ?Calling to let Dr. Mena Goes know he would like to take the Rx to help with vasectomy on Mon. 10-02-21  at 2:45. ? ?He originally said he didn't need it. ? ?Please advise. ? ? ?Call back:  [phone] ? ? ?Thanks, ?Rosey Bath ?

## 2021-10-02 ENCOUNTER — Other Ambulatory Visit: Payer: Self-pay | Admitting: Urology

## 2021-10-02 ENCOUNTER — Other Ambulatory Visit: Payer: Self-pay

## 2021-10-02 ENCOUNTER — Ambulatory Visit (INDEPENDENT_AMBULATORY_CARE_PROVIDER_SITE_OTHER): Payer: BC Managed Care – PPO | Admitting: Urology

## 2021-10-02 VITALS — BP 143/84 | HR 52

## 2021-10-02 DIAGNOSIS — Z302 Encounter for sterilization: Secondary | ICD-10-CM

## 2021-10-02 DIAGNOSIS — Z9852 Vasectomy status: Secondary | ICD-10-CM

## 2021-10-02 MED ORDER — DIAZEPAM 5 MG PO TABS: 15.0000 mg | ORAL_TABLET | Freq: Once | ORAL | 0 refills | Status: DC

## 2021-10-02 MED ORDER — DIAZEPAM 5 MG PO TABS: 15.0000 mg | ORAL_TABLET | Freq: Once | ORAL | 0 refills | Status: AC

## 2021-10-02 NOTE — Patient Instructions (Signed)
Vasectomy Postoperative Instructions  Please bring back a semen analysis in approximately 3 months.  Your semen analysis  will need to be taken to  [facility] [address] [place], [place] [address] There phone number is [phone]. Please call and schedule an appointment before taking your specimen.    You will be given a sterile specimen cup. Please label the cup with your name, date of birth, date and time of collections.  What to Expect  - slight redness, swelling and scant drainage along the incision  - mild to moderate discomfort  - black and blue (bruising) as the tissue heals  - low grade fever  - scrotal sensitivity and/or tenderness - Edges of the incision may pull apart and heal slowly, sometimes a knot may be present which remains for several months.  This is NORMAL and all part of the healing process. - if stitches are placed, they do not need to be removed - if you have pain or discomfort immediately after the vasectomy, you may use OTC pain medication for relief , ex: tylenol.  After local anesthetic wears off an ice pack will provide additional comfort and can also prevent swelling if used  Activity  - no sexual intercourse for at lease 5 days depending on comfort  - no heavy lifting for 48-72 hours (anything over 5-10 lbs)  Wound Care  - shower only after 24 hours  - no tub baths, hot tub, or pools for at least 7 days  - ice packs for 48 hours: 30 minutes on and 30 minutes off  Problem to Report  - generalized redness  - increased pain and swelling  - fever greater than 101 F  - significant drainage or bleeding from the wound  TO DO - Ejaculations help to clear the passage of sperm, but you must use another from of birth control until you are told you may discontinue its use!! - You will be given a specimen cup to bring back a semen sample in 3 months to check and see if its clear of sperm.  Only after the semen is sent for analysis and is reported back as clear  should you use this as your primary form of birth control!   

## 2021-10-02 NOTE — Progress Notes (Signed)
Here for vasectomy. No new questions. Again discussed permanent nature of vasectomy and sterilization not immediate.  ? ?The patient was prepped and draped in the usual sterile fashion. A timeout was performed to confirm the patient and procedure. The skin and left vas was grasped and infiltrated with local. A puncture hole was made with a sharp hemostat and the vas was grasped with a ring Clamp and And brought out through the incision. The sharp hemostat was used to clear the vasal fascia and once clear a 1 cm segment of the vas was excised and removed.The needlepoint Bovie was placed in the proximal and distal lumens and cautery was performed. The proximal and distal lumens were buried within the sheath with a 3-0 chromic suture providing good fascial interposition. Hemostasis was excellent. The right vas was isolated and through the same puncture whole, was infiltrated with local, a segment excised, the lumens cauterized, and both ends buried under the sheath. Hemostasis was excellent. The puncture hole was closed with a single horizontal mattress suture. The patient tolerated the procedure well. He was cleaned and a dressing applied. Discussed again post-op restrictions.  ? ?Also discussed again vasectomy does not produce immediate sterilization (which can take several months), therefore continue other birth control. ? ?

## 2021-10-02 NOTE — Telephone Encounter (Signed)
Dr. Mena Goes made aware. Medication sent in.  ?

## 2022-01-18 LAB — POST-VAS SPERM EVALUATION,QUAL: Volume: 2.5 mL
